# Patient Record
Sex: Female | Born: 1965 | Race: White | Hispanic: No | Marital: Married | Smoking: Never smoker
Health system: Southern US, Community
[De-identification: ages and names within clinical notes are randomized; demographics above are authoritative.]

## PROBLEM LIST (undated history)

## (undated) DIAGNOSIS — Z9889 Other specified postprocedural states: Secondary | ICD-10-CM

## (undated) DIAGNOSIS — R112 Nausea with vomiting, unspecified: Secondary | ICD-10-CM

## (undated) DIAGNOSIS — R519 Headache, unspecified: Secondary | ICD-10-CM

## (undated) DIAGNOSIS — R51 Headache: Secondary | ICD-10-CM

## (undated) DIAGNOSIS — T4145XA Adverse effect of unspecified anesthetic, initial encounter: Secondary | ICD-10-CM

## (undated) DIAGNOSIS — F32A Depression, unspecified: Secondary | ICD-10-CM

## (undated) DIAGNOSIS — T8859XA Other complications of anesthesia, initial encounter: Secondary | ICD-10-CM

## (undated) DIAGNOSIS — F329 Major depressive disorder, single episode, unspecified: Secondary | ICD-10-CM

## (undated) HISTORY — DX: Depression, unspecified: F32.A

## (undated) HISTORY — PX: TUBAL LIGATION: SHX77

## (undated) HISTORY — DX: Major depressive disorder, single episode, unspecified: F32.9

---

## 2003-10-11 ENCOUNTER — Ambulatory Visit (HOSPITAL_COMMUNITY): Admission: RE | Admit: 2003-10-11 | Discharge: 2003-10-11 | Payer: Self-pay | Admitting: Internal Medicine

## 2003-10-11 HISTORY — PX: COLONOSCOPY: SHX174

## 2004-01-03 ENCOUNTER — Other Ambulatory Visit: Admission: RE | Admit: 2004-01-03 | Discharge: 2004-01-03 | Payer: Self-pay | Admitting: Dermatology

## 2007-12-29 ENCOUNTER — Ambulatory Visit (HOSPITAL_COMMUNITY): Admission: RE | Admit: 2007-12-29 | Discharge: 2007-12-29 | Payer: Self-pay | Admitting: Family Medicine

## 2011-02-09 NOTE — Op Note (Signed)
NAME:  Erin Luna, Erin Luna                        ACCOUNT NO.:  [id number]   MEDICAL RECORD NO.:  [id number]                  PATIENT TYPE:  AMB   LOCATION:                                       FACILITY:   PHYSICIAN:  R. Roetta Sessions, M.D.              DATE OF BIRTH:   DATE OF PROCEDURE:  10/11/2003  DATE OF DISCHARGE:                                 OPERATIVE REPORT   PROCEDURE:  Diagnostic colonoscopy and ileoscopy.   ENDOSCOPIST:  Gerrit Friends. Rourk, M.D.   INDICATIONS FOR PROCEDURE:  The patient is a 45 year old lady with a 52-month  history of altered bowel habits.  She describes abdominal rumbling and a  tendency towards diarrhea over the past 7 months.  Stool studies through Dr.  Aurther Loft Daniel's office have been negative.  We started her on some NuLev  recently.  This did not help her symptoms and she wanted to have a  colonoscopy.  We talked about the pros and cons of colonoscopy including the  risk and benefits.  This being done to rule out, basically, an inflammatory  process contributing to her tendency towards diarrhea.  This approach has  been discussed with the patient previously, as stated above.  Please see the  office dictation of September 15, 2003 for more information.   PROCEDURE NOTE:  O2 saturation, blood pressure, pulse and respirations were  monitored throughout the entire procedure.  Conscious sedation: Versed 5 mg IV, Demerol 75 mg IV.   INSTRUMENT:  Olympus 140 colonoscope.   FINDINGS:  A digital rectal exam revealed no abnormalities.   ENDOSCOPIC FINDINGS:  The prep was adequate.   RECTUM:  Examination of the rectal mucosa including the retroflex view of  the anal verge revealed no abnormalities.   COLON:  The colonic mucosa was surveyed from the rectosigmoid junction  through the left transverse and right colon to the area of the appendiceal  orifice, ileocecal valve, and cecum.  These structures were well seen and  photographed for the record.   From this level the scope was slowly withdrawn.  All previously mentioned  mucosal surfaces were again seen.  The colonic mucosa appeared normal.  The  terminal ileum was intubated a good 15 cm and this area of the GI tract also  appeared normal.  Stool residue was suctioned for microbiology studies.  The  patient tolerated the procedure well and was reacted in endoscopy.   IMPRESSION:  1. Normal rectum.  2. Normal colon.  3. Normal terminal ileum.  Stool sample submitted.   RECOMMENDATIONS:  This patient's symptoms most likely will end up being  secondary to diarrhea predominately irritable bowel syndrome.  We will  double check her stool studies.  I have asked her to stop NuLev and begin  Librax 1 tablet before meals and at bedtime, p.r.n. abdominal cramps,  rumbling, and diarrhea.  Further recommendations to follow.  ___________________________________________                                            Jonathon Bellows, M.D.   RMR/MEDQ  D:  10/11/2003  T:  10/12/2003  Job:  [id number]   cc:   Donzetta Sprung  [address], [address]  [place]  [place] [address]  Fax: [phone]   R. Roetta Sessions, M.D.  [address]  [place]  [place] [address]  Fax: [phone]

## 2012-08-06 ENCOUNTER — Other Ambulatory Visit (HOSPITAL_COMMUNITY): Payer: Self-pay | Admitting: Obstetrics & Gynecology

## 2012-08-06 DIAGNOSIS — Z139 Encounter for screening, unspecified: Secondary | ICD-10-CM

## 2012-08-14 ENCOUNTER — Ambulatory Visit (HOSPITAL_COMMUNITY)
Admission: RE | Admit: 2012-08-14 | Discharge: 2012-08-14 | Disposition: A | Discharge status: Home / Self Care | Payer: BC Managed Care – PPO | Source: Ambulatory Visit | Attending: Obstetrics & Gynecology | Admitting: Obstetrics & Gynecology

## 2012-08-14 DIAGNOSIS — Z1231 Encounter for screening mammogram for malignant neoplasm of breast: Secondary | ICD-10-CM | POA: Insufficient documentation

## 2012-08-14 DIAGNOSIS — Z139 Encounter for screening, unspecified: Secondary | ICD-10-CM

## 2013-07-28 ENCOUNTER — Other Ambulatory Visit (HOSPITAL_COMMUNITY): Payer: Self-pay | Admitting: Obstetrics & Gynecology

## 2013-07-28 ENCOUNTER — Other Ambulatory Visit (HOSPITAL_COMMUNITY): Payer: Self-pay | Admitting: *Deleted

## 2013-07-28 DIAGNOSIS — Z139 Encounter for screening, unspecified: Secondary | ICD-10-CM

## 2013-08-17 ENCOUNTER — Ambulatory Visit (HOSPITAL_COMMUNITY)
Admission: RE | Admit: 2013-08-17 | Discharge: 2013-08-17 | Disposition: A | Discharge status: Home / Self Care | Payer: BC Managed Care – PPO | Source: Ambulatory Visit | Attending: Obstetrics & Gynecology | Admitting: Obstetrics & Gynecology

## 2013-08-17 DIAGNOSIS — Z1231 Encounter for screening mammogram for malignant neoplasm of breast: Secondary | ICD-10-CM | POA: Insufficient documentation

## 2013-08-17 DIAGNOSIS — Z139 Encounter for screening, unspecified: Secondary | ICD-10-CM

## 2015-05-25 ENCOUNTER — Other Ambulatory Visit: Payer: Self-pay

## 2015-05-25 ENCOUNTER — Telehealth: Payer: Self-pay

## 2015-05-25 NOTE — Telephone Encounter (Signed)
PT was referred by Dr. Reuel Boom for screening colonoscopy. Last one with Dr. Jena Gauss on 10/11/2003. She has been scheduled an OV with Tana Coast, PA on 06/14/2015 at 9:30 Am due to her med list.

## 2015-05-25 NOTE — Telephone Encounter (Signed)
Opened in error

## 2015-06-14 ENCOUNTER — Ambulatory Visit (INDEPENDENT_AMBULATORY_CARE_PROVIDER_SITE_OTHER): Payer: Commercial Managed Care - HMO | Admitting: Gastroenterology

## 2015-06-14 ENCOUNTER — Encounter: Payer: Self-pay | Admitting: Gastroenterology

## 2015-06-14 ENCOUNTER — Other Ambulatory Visit: Payer: Self-pay

## 2015-06-14 VITALS — BP 111/70 | HR 85 | Temp 97.8°F | Ht 67.0 in | Wt 157.6 lb

## 2015-06-14 DIAGNOSIS — Z79899 Other long term (current) drug therapy: Secondary | ICD-10-CM | POA: Diagnosis not present

## 2015-06-14 DIAGNOSIS — Z1211 Encounter for screening for malignant neoplasm of colon: Secondary | ICD-10-CM | POA: Diagnosis not present

## 2015-06-14 MED ORDER — PEG 3350-KCL-NA BICARB-NACL 420 G PO SOLR: 4000.0000 mL | Freq: Once | ORAL | Status: AC

## 2015-06-14 NOTE — Patient Instructions (Signed)
1. Colonoscopy in near future. See separate instructions.  

## 2015-06-14 NOTE — Progress Notes (Signed)
cc'ed to pcp °

## 2015-06-14 NOTE — Assessment & Plan Note (Signed)
49 year old female due for screening colonoscopy. Plan on deep sedation in the OR given polypharmacy.  I have discussed the risks, alternatives, benefits with regards to but not limited to the risk of reaction to medication, bleeding, infection, perforation and the patient is agreeable to proceed. Written consent to be obtained.

## 2015-06-14 NOTE — Progress Notes (Signed)
Primary Care Physician:  Donzetta Sprung, MD  Primary Gastroenterologist:  Roetta Sessions, MD   Chief Complaint  Patient presents with  . Colonoscopy    HPI:  Erin Luna is a 49 y.o. female here to schedule screening colonoscopy. Because she is on multiple antidepressants, she was asked to come in for an office visit to determine type of sedation.  Doing well. Generally has regular bowel movements. Occasional IBS D flare. Seems to be triggered by anxiety. No melena, brbpr. No heartburn, dysphagia. Appetite okay. No unintentional weight loss. No family history of colon cancer or colon polyps.  Last colonoscopy in January 2005 when she presented with diarrhea. Unremarkable exam including normal terminal ileum.  Current Outpatient Prescriptions  Medication Sig Dispense Refill  . buPROPion (WELLBUTRIN SR) 150 MG 12 hr tablet Take 150 mg by mouth daily.    . DULoxetine (CYMBALTA) 60 MG capsule Take 60 mg by mouth daily.    . traZODone (DESYREL) 50 MG tablet Take 50 mg by mouth at bedtime.     No current facility-administered medications for this visit.    Allergies as of 06/14/2015 - Review Complete 06/14/2015  Allergen Reaction Noted  . Penicillins Rash 05/25/2015    Past Medical History  Diagnosis Date  . Depression     Past Surgical History  Procedure Laterality Date  . Colonoscopy  10/11/2003    ZOX:[id number] colon and rectum  . Tubal ligation      Family History  Problem Relation Age of Onset  . Colon cancer Neg Hx   . Colon polyps Neg Hx   . Diabetes Mother     Social History   Social History  . Marital Status: Divorced    Spouse Name: N/A  . Number of Children: 1  . Years of Education: N/A   Occupational History  . loan clerk    Social History Main Topics  . Smoking status: Never Smoker   . Smokeless tobacco: Not on file  . Alcohol Use: No  . Drug Use: No  . Sexual Activity: Not on file   Other Topics Concern  . Not on file   Social History  Narrative  . No narrative on file      ROS:  General: Negative for anorexia, weight loss, fever, chills, fatigue, weakness. Eyes: Negative for vision changes.  ENT: Negative for hoarseness, difficulty swallowing , nasal congestion. CV: Negative for chest pain, angina, palpitations, dyspnea on exertion, peripheral edema.  Respiratory: Negative for dyspnea at rest, dyspnea on exertion, cough, sputum, wheezing.  GI: See history of present illness. GU:  Negative for dysuria, hematuria, urinary incontinence, urinary frequency, nocturnal urination.  MS: Negative for joint pain, low back pain.  Derm: Negative for rash or itching.  Neuro: Negative for weakness, abnormal sensation, seizure, frequent headaches, memory loss, confusion.  Psych: Negative for anxiety, depression, suicidal ideation, hallucinations.  Endo: Negative for unusual weight change.  Heme: Negative for bruising or bleeding. Allergy: Negative for rash or hives.    Physical Examination:  BP 111/70 mmHg  Pulse 85  Temp(Src) 97.8 F (36.6 C) (Oral)  Ht  (1.702 m)  Wt 157 lb 9.6 oz (71.487 kg)  BMI 24.68 kg/m2  LMP 06/14/2015   General: Well-nourished, well-developed in no acute distress.  Head: Normocephalic, atraumatic.   Eyes: Conjunctiva pink, no icterus. Mouth: Oropharyngeal mucosa moist and pink , no lesions erythema or exudate. Neck: Supple without thyromegaly, masses, or lymphadenopathy.  Lungs: Clear to auscultation bilaterally.  Heart: Regular  rate and rhythm, no murmurs rubs or gallops.  Abdomen: Bowel sounds are normal, nontender, nondistended, no hepatosplenomegaly or masses, no abdominal bruits or    hernia , no rebound or guarding.   Rectal: deferred to time of colonoscopy. Extremities: No lower extremity edema. No clubbing or deformities.  Neuro: Alert and oriented x 4 , grossly normal neurologically.  Skin: Warm and dry, no rash or jaundice.   Psych: Alert and cooperative, normal mood and  affect.  Labs: None available.  Imaging Studies: No results found.

## 2015-06-30 NOTE — Patient Instructions (Signed)
Erin Luna  06/30/2015     @   Your procedure is scheduled on 07/07/2015.  Report to [facility] at 8:45 A.M.  Call this number if you have problems the morning of surgery:  [phone]   Remember:  Do not eat food or drink liquids after midnight.  Take these medicines the morning of surgery with A SIP OF WATER Wellbutrin, Cymbalta   Do not wear jewelry, make-up or nail polish.  Do not wear lotions, powders, or perfumes.  You may wear deodorant.  Do not shave 48 hours prior to surgery.  Men may shave face and neck.  Do not bring valuables to the hospital.  [facility] is not responsible for any belongings or valuables.  Contacts, dentures or bridgework may not be worn into surgery.  Leave your suitcase in the car.  After surgery it may be brought to your room.  For patients admitted to the hospital, discharge time will be determined by your treatment team.  Patients discharged the day of surgery will not be allowed to drive home.    Please read over the following fact sheets that you were given. Anesthesia Post-op Instructions     PATIENT INSTRUCTIONS POST-ANESTHESIA  IMMEDIATELY FOLLOWING SURGERY:  Do not drive or operate machinery for the first twenty four hours after surgery.  Do not make any important decisions for twenty four hours after surgery or while taking narcotic pain medications or sedatives.  If you develop intractable nausea and vomiting or a severe headache please notify your doctor immediately.  FOLLOW-UP:  Please make an appointment with your surgeon as instructed. You do not need to follow up with anesthesia unless specifically instructed to do so.  WOUND CARE INSTRUCTIONS (if applicable):  Keep a dry clean dressing on the anesthesia/puncture wound site if there is drainage.  Once the wound has quit draining you may leave it open to air.  Generally you should leave the bandage intact for twenty four hours unless there is drainage.   If the epidural site drains for more than 36-48 hours please call the anesthesia department.  QUESTIONS?:  Please feel free to call your physician or the hospital operator if you have any questions, and they will be happy to assist you.      Colonoscopy A colonoscopy is an exam to look at the entire large intestine (colon). This exam can help find problems such as tumors, polyps, inflammation, and areas of bleeding. The exam takes about 1 hour.  LET [facility] CARE PROVIDER KNOW ABOUT:   Any allergies you have.  All medicines you are taking, including vitamins, herbs, eye drops, creams, and over-the-counter medicines.  Previous problems you or members of your family have had with the use of anesthetics.  Any blood disorders you have.  Previous surgeries you have had.  Medical conditions you have. RISKS AND COMPLICATIONS  Generally, this is a safe procedure. However, as with any procedure, complications can occur. Possible complications include:  Bleeding.  Tearing or rupture of the colon wall.  Reaction to medicines given during the exam.  Infection (rare). BEFORE THE PROCEDURE   Ask your health care provider about changing or stopping your regular medicines.  You may be prescribed an oral bowel prep. This involves drinking a large amount of medicated liquid, starting the day before your procedure. The liquid will cause you to have multiple loose stools until your stool is almost clear or light green. This cleans out your colon in preparation  for the procedure.  Do not eat or drink anything else once you have started the bowel prep, unless your health care provider tells you it is safe to do so.  Arrange for someone to drive you home after the procedure. PROCEDURE   You will be given medicine to help you relax (sedative).  You will lie on your side with your knees bent.  A long, flexible tube with a light and camera on the end (colonoscope) will be inserted through the  rectum and into the colon. The camera sends video back to a computer screen as it moves through the colon. The colonoscope also releases carbon dioxide gas to inflate the colon. This helps your health care provider see the area better.  During the exam, your health care provider may take a small tissue sample (biopsy) to be examined under a microscope if any abnormalities are found.  The exam is finished when the entire colon has been viewed. AFTER THE PROCEDURE   Do not drive for 24 hours after the exam.  You may have a small amount of blood in your stool.  You may pass moderate amounts of gas and have mild abdominal cramping or bloating. This is caused by the gas used to inflate your colon during the exam.  Ask when your test results will be ready and how you will get your results. Make sure you get your test results.   This information is not intended to replace advice given to you by your health care provider. Make sure you discuss any questions you have with your health care provider.   Document Released: 09/07/2000 Document Revised: 07/01/2013 Document Reviewed: 05/18/2013 Elsevier Interactive Patient Education [employer].

## 2015-07-01 ENCOUNTER — Encounter (HOSPITAL_COMMUNITY): Payer: Self-pay

## 2015-07-01 ENCOUNTER — Encounter (HOSPITAL_COMMUNITY)
Admission: RE | Admit: 2015-07-01 | Discharge: 2015-07-01 | Disposition: A | Discharge status: Home / Self Care | Payer: Commercial Managed Care - HMO | Source: Ambulatory Visit | Attending: Internal Medicine | Admitting: Internal Medicine

## 2015-07-01 DIAGNOSIS — Z01818 Encounter for other preprocedural examination: Secondary | ICD-10-CM | POA: Insufficient documentation

## 2015-07-01 HISTORY — DX: Adverse effect of unspecified anesthetic, initial encounter: T41.45XA

## 2015-07-01 HISTORY — DX: Other specified postprocedural states: R11.2

## 2015-07-01 HISTORY — DX: Other complications of anesthesia, initial encounter: T88.59XA

## 2015-07-01 HISTORY — DX: Other specified postprocedural states: Z98.890

## 2015-07-01 HISTORY — DX: Nausea with vomiting, unspecified: R11.2

## 2015-07-01 LAB — CBC
HEMATOCRIT: 37.2 % (ref 36.0–46.0)
HEMOGLOBIN: 12.3 g/dL (ref 12.0–15.0)
MCH: 29.9 pg (ref 26.0–34.0)
MCHC: 33.1 g/dL (ref 30.0–36.0)
MCV: 90.5 fL (ref 78.0–100.0)
Platelets: 307 10*3/uL (ref 150–400)
RBC: 4.11 MIL/uL (ref 3.87–5.11)
RDW: 12.8 % (ref 11.5–15.5)
WBC: 5.1 10*3/uL (ref 4.0–10.5)

## 2015-07-01 LAB — BASIC METABOLIC PANEL
ANION GAP: 5 (ref 5–15)
BUN: 12 mg/dL (ref 6–20)
CALCIUM: 9 mg/dL (ref 8.9–10.3)
CO2: 27 mmol/L (ref 22–32)
CREATININE: 0.64 mg/dL (ref 0.44–1.00)
Chloride: 107 mmol/L (ref 101–111)
GFR calc non Af Amer: >60 mL/min (ref >60)
Glucose, Bld: 95 mg/dL (ref 65–99)
Potassium: 4.4 mmol/L (ref 3.5–5.1)
SODIUM: 139 mmol/L (ref 135–145)

## 2015-07-07 ENCOUNTER — Ambulatory Visit (HOSPITAL_COMMUNITY)
Admission: RE | Admit: 2015-07-07 | Discharge: 2015-07-07 | Disposition: A | Discharge status: Home / Self Care | Payer: Commercial Managed Care - HMO | Source: Ambulatory Visit | Attending: Internal Medicine | Admitting: Internal Medicine

## 2015-07-07 ENCOUNTER — Encounter (HOSPITAL_COMMUNITY)
Admission: RE | Disposition: A | Discharge status: Home / Self Care | Payer: Self-pay | Source: Ambulatory Visit | Attending: Internal Medicine

## 2015-07-07 ENCOUNTER — Ambulatory Visit (HOSPITAL_COMMUNITY): Payer: Commercial Managed Care - HMO | Admitting: Anesthesiology

## 2015-07-07 ENCOUNTER — Encounter (HOSPITAL_COMMUNITY): Payer: Self-pay | Admitting: *Deleted

## 2015-07-07 DIAGNOSIS — Z1211 Encounter for screening for malignant neoplasm of colon: Secondary | ICD-10-CM | POA: Diagnosis not present

## 2015-07-07 DIAGNOSIS — F329 Major depressive disorder, single episode, unspecified: Secondary | ICD-10-CM | POA: Diagnosis not present

## 2015-07-07 HISTORY — DX: Headache: R51

## 2015-07-07 HISTORY — DX: Headache, unspecified: R51.9

## 2015-07-07 HISTORY — PX: COLONOSCOPY WITH PROPOFOL: SHX5780

## 2015-07-07 SURGERY — COLONOSCOPY WITH PROPOFOL
Anesthesia: Monitor Anesthesia Care

## 2015-07-07 MED ORDER — LIDOCAINE HCL (PF) 1 % IJ SOLN
INTRAMUSCULAR | Status: AC
  Filled 2015-07-07: qty 5

## 2015-07-07 MED ORDER — STERILE WATER FOR IRRIGATION IR SOLN
Status: DC | PRN
  Administered 2015-07-07: 1000 mL

## 2015-07-07 MED ORDER — GLYCOPYRROLATE 0.2 MG/ML IJ SOLN
0.2000 mg | Freq: Once | INTRAMUSCULAR | Status: AC
  Administered 2015-07-07: 0.2 mg via INTRAVENOUS

## 2015-07-07 MED ORDER — PROPOFOL 10 MG/ML IV BOLUS
INTRAVENOUS | Status: DC | PRN
  Administered 2015-07-07: 21 mg via INTRAVENOUS
  Administered 2015-07-07: 14 mg via INTRAVENOUS

## 2015-07-07 MED ORDER — FENTANYL CITRATE (PF) 100 MCG/2ML IJ SOLN
25.0000 ug | INTRAMUSCULAR | Status: AC
  Administered 2015-07-07: 25 ug via INTRAVENOUS

## 2015-07-07 MED ORDER — PROPOFOL 10 MG/ML IV BOLUS
INTRAVENOUS | Status: AC
  Filled 2015-07-07: qty 20

## 2015-07-07 MED ORDER — WATER FOR IRRIGATION, STERILE IR SOLN
Status: DC | PRN
  Administered 2015-07-07: 1000 mL

## 2015-07-07 MED ORDER — GLYCOPYRROLATE 0.2 MG/ML IJ SOLN
INTRAMUSCULAR | Status: AC
  Filled 2015-07-07: qty 1

## 2015-07-07 MED ORDER — MIDAZOLAM HCL 2 MG/2ML IJ SOLN
INTRAMUSCULAR | Status: AC
  Filled 2015-07-07: qty 2

## 2015-07-07 MED ORDER — LACTATED RINGERS IV SOLN
INTRAVENOUS | Status: DC
  Administered 2015-07-07: 10:00:00 via INTRAVENOUS

## 2015-07-07 MED ORDER — PROPOFOL 500 MG/50ML IV EMUL
INTRAVENOUS | Status: DC | PRN
  Administered 2015-07-07 (×2): 125 ug/kg/min via INTRAVENOUS

## 2015-07-07 MED ORDER — MIDAZOLAM HCL 2 MG/2ML IJ SOLN
1.0000 mg | INTRAMUSCULAR | Status: DC | PRN
Start: 2015-07-07 — End: 2015-07-07
  Administered 2015-07-07: 2 mg via INTRAVENOUS

## 2015-07-07 MED ORDER — FENTANYL CITRATE (PF) 100 MCG/2ML IJ SOLN
INTRAMUSCULAR | Status: AC
  Filled 2015-07-07: qty 2

## 2015-07-07 MED ORDER — ONDANSETRON HCL 4 MG/2ML IJ SOLN
4.0000 mg | Freq: Once | INTRAMUSCULAR | Status: AC | PRN
  Administered 2015-07-07: 4 mg via INTRAVENOUS

## 2015-07-07 MED ORDER — FENTANYL CITRATE (PF) 100 MCG/2ML IJ SOLN: 25.0000 ug | INTRAMUSCULAR | Status: DC | PRN

## 2015-07-07 MED ORDER — ONDANSETRON HCL 4 MG/2ML IJ SOLN: 4.0000 mg | Freq: Once | INTRAMUSCULAR | Status: DC

## 2015-07-07 MED ORDER — ONDANSETRON HCL 4 MG/2ML IJ SOLN
INTRAMUSCULAR | Status: AC
  Filled 2015-07-07: qty 2

## 2015-07-07 MED ORDER — LIDOCAINE HCL (CARDIAC) 10 MG/ML IV SOLN
INTRAVENOUS | Status: DC | PRN
  Administered 2015-07-07: 50 mg via INTRAVENOUS

## 2015-07-07 SURGICAL SUPPLY — 23 items
ELECT REM PT RETURN 9FT ADLT (ELECTROSURGICAL)
ELECTRODE REM PT RTRN 9FT ADLT (ELECTROSURGICAL) IMPLANT
FCP BXJMBJMB 240X2.8X (CUTTING FORCEPS)
FLOOR PAD 36X40 (MISCELLANEOUS)
FORCEPS BIOP RAD 4 LRG CAP 4 (CUTTING FORCEPS) IMPLANT
FORCEPS BIOP RJ4 240 W/NDL (CUTTING FORCEPS)
FORCEPS BXJMBJMB 240X2.8X (CUTTING FORCEPS) IMPLANT
FORMALIN 10 PREFIL 20ML (MISCELLANEOUS) IMPLANT
INJECTOR/SNARE I SNARE (MISCELLANEOUS) IMPLANT
KIT ENDO PROCEDURE PEN (KITS) ×3 IMPLANT
MANIFOLD NEPTUNE II (INSTRUMENTS) ×2 IMPLANT
NDL SCLEROTHERAPY 25GX240 (NEEDLE) IMPLANT
NEEDLE SCLEROTHERAPY 25GX240 (NEEDLE) IMPLANT
PAD FLOOR 36X40 (MISCELLANEOUS) IMPLANT
PROBE APC STR FIRE (PROBE) IMPLANT
PROBE INJECTION GOLD (MISCELLANEOUS)
PROBE INJECTION GOLD 7FR (MISCELLANEOUS) IMPLANT
SNARE ROTATE MED OVAL 20MM (MISCELLANEOUS) IMPLANT
SNARE SHORT THROW 13M SML OVAL (MISCELLANEOUS) IMPLANT
SYR 50ML LL SCALE MARK (SYRINGE) ×3 IMPLANT
TRAP SPECIMEN MUCOUS 40CC (MISCELLANEOUS) IMPLANT
TUBING IRRIGATION ENDOGATOR (MISCELLANEOUS) ×3 IMPLANT
WATER STERILE IRR 1000ML POUR (IV SOLUTION) ×3 IMPLANT

## 2015-07-07 NOTE — Transfer of Care (Signed)
Immediate Anesthesia Transfer of Care Note  Patient: Erin Luna  Procedure(s) Performed: Procedure(s): COLONOSCOPY WITH PROPOFOL; IN CECUM AT 1013; WITHDRAWAL TIME 7 MINUTES (N/A)  Patient Location: PACU  Anesthesia Type:MAC  Level of Consciousness: patient cooperative  Airway & Oxygen Therapy: Patient Spontanous Breathing and non-rebreather face mask  Post-op Assessment: Report given to RN, Post -op Vital signs reviewed and stable and Patient moving all extremities  Post vital signs: Reviewed and stable    Complications: No apparent anesthesia complications

## 2015-07-07 NOTE — H&P (View-Only) (Signed)
Primary Care Physician:  Donzetta Sprung, MD  Primary Gastroenterologist:  Roetta Sessions, MD   Chief Complaint  Patient presents with  . Colonoscopy    HPI:  Erin Luna is a 49 y.o. female here to schedule screening colonoscopy. Because she is on multiple antidepressants, she was asked to come in for an office visit to determine type of sedation.  Doing well. Generally has regular bowel movements. Occasional IBS D flare. Seems to be triggered by anxiety. No melena, brbpr. No heartburn, dysphagia. Appetite okay. No unintentional weight loss. No family history of colon cancer or colon polyps.  Last colonoscopy in January 2005 when she presented with diarrhea. Unremarkable exam including normal terminal ileum.  Current Outpatient Prescriptions  Medication Sig Dispense Refill  . buPROPion (WELLBUTRIN SR) 150 MG 12 hr tablet Take 150 mg by mouth daily.    . DULoxetine (CYMBALTA) 60 MG capsule Take 60 mg by mouth daily.    . traZODone (DESYREL) 50 MG tablet Take 50 mg by mouth at bedtime.     No current facility-administered medications for this visit.    Allergies as of 06/14/2015 - Review Complete 06/14/2015  Allergen Reaction Noted  . Penicillins Rash 05/25/2015    Past Medical History  Diagnosis Date  . Depression     Past Surgical History  Procedure Laterality Date  . Colonoscopy  10/11/2003    [id number] colon and rectum  . Tubal ligation      Family History  Problem Relation Age of Onset  . Colon cancer Neg Hx   . Colon polyps Neg Hx   . Diabetes Mother     Social History   Social History  . Marital Status: Divorced    Spouse Name: N/A  . Number of Children: 1  . Years of Education: N/A   Occupational History  . loan clerk    Social History Main Topics  . Smoking status: Never Smoker   . Smokeless tobacco: Not on file  . Alcohol Use: No  . Drug Use: No  . Sexual Activity: Not on file   Other Topics Concern  . Not on file   Social History  Narrative  . No narrative on file      ROS:  General: Negative for anorexia, weight loss, fever, chills, fatigue, weakness. Eyes: Negative for vision changes.  ENT: Negative for hoarseness, difficulty swallowing , nasal congestion. CV: Negative for chest pain, angina, palpitations, dyspnea on exertion, peripheral edema.  Respiratory: Negative for dyspnea at rest, dyspnea on exertion, cough, sputum, wheezing.  GI: See history of present illness. GU:  Negative for dysuria, hematuria, urinary incontinence, urinary frequency, nocturnal urination.  MS: Negative for joint pain, low back pain.  Derm: Negative for rash or itching.  Neuro: Negative for weakness, abnormal sensation, seizure, frequent headaches, memory loss, confusion.  Psych: Negative for anxiety, depression, suicidal ideation, hallucinations.  Endo: Negative for unusual weight change.  Heme: Negative for bruising or bleeding. Allergy: Negative for rash or hives.    Physical Examination:  BP 111/70 mmHg  Pulse 85  Temp(Src) 97.8 F (36.6 C) (Oral)  Ht  (1.702 m)  Wt 157 lb 9.6 oz (71.487 kg)  BMI 24.68 kg/m2  LMP 06/14/2015   General: Well-nourished, well-developed in no acute distress.  Head: Normocephalic, atraumatic.   Eyes: Conjunctiva pink, no icterus. Mouth: Oropharyngeal mucosa moist and pink , no lesions erythema or exudate. Neck: Supple without thyromegaly, masses, or lymphadenopathy.  Lungs: Clear to auscultation bilaterally.  Heart: Regular  rate and rhythm, no murmurs rubs or gallops.  Abdomen: Bowel sounds are normal, nontender, nondistended, no hepatosplenomegaly or masses, no abdominal bruits or    hernia , no rebound or guarding.   Rectal: deferred to time of colonoscopy. Extremities: No lower extremity edema. No clubbing or deformities.  Neuro: Alert and oriented x 4 , grossly normal neurologically.  Skin: Warm and dry, no rash or jaundice.   Psych: Alert and cooperative, normal mood and  affect.  Labs: None available.  Imaging Studies: No results found.

## 2015-07-07 NOTE — Anesthesia Procedure Notes (Signed)
Procedure Name: MAC Date/Time: 07/07/2015 9:54 AM Performed by: Franco NonesYATES, Gladyes Kudo S Pre-anesthesia Checklist: Patient identified, Emergency Drugs available, Suction available, Timeout performed and Patient being monitored Patient Re-evaluated:Patient Re-evaluated prior to inductionOxygen Delivery Method: Non-rebreather mask

## 2015-07-07 NOTE — Anesthesia Preprocedure Evaluation (Signed)
Anesthesia Evaluation  Patient identified by MRN, date of birth, ID band Patient awake    Reviewed: Allergy & Precautions, NPO status , Patient's Chart, lab work & pertinent test results  History of Anesthesia Complications (+) PONV and history of anesthetic complications  Airway Mallampati: II  TM Distance: >3 FB     Dental  (+) Teeth Intact   Pulmonary neg pulmonary ROS,    breath sounds clear to auscultation       Cardiovascular negative cardio ROS   Rhythm:Regular Rate:Normal     Neuro/Psych PSYCHIATRIC DISORDERS Depression    GI/Hepatic negative GI ROS,   Endo/Other    Renal/GU      Musculoskeletal   Abdominal   Peds  Hematology   Anesthesia Other Findings   Reproductive/Obstetrics                             Anesthesia Physical Anesthesia Plan  ASA: II  Anesthesia Plan: MAC   Post-op Pain Management:    Induction: Intravenous  Airway Management Planned: Simple Face Mask  Additional Equipment:   Intra-op Plan:   Post-operative Plan:   Informed Consent: I have reviewed the patients History and Physical, chart, labs and discussed the procedure including the risks, benefits and alternatives for the proposed anesthesia with the patient or authorized representative who has indicated his/her understanding and acceptance.     Plan Discussed with:   Anesthesia Plan Comments:         Anesthesia Quick Evaluation

## 2015-07-07 NOTE — Discharge Instructions (Signed)

## 2015-07-07 NOTE — Interval H&P Note (Signed)
History and Physical Interval Note:  07/07/2015 9:35 AM  Erin Luna  has presented today for surgery, with the diagnosis of screening colonoscopy  The various methods of treatment have been discussed with the patient and family. After consideration of risks, benefits and other options for treatment, the patient has consented to  Procedure(s): COLONOSCOPY WITH PROPOFOL (N/A) as a surgical intervention .  The patient's history has been reviewed, patient examined, no change in status, stable for surgery.  I have reviewed the patient's chart and labs.  Questions were answered to the patient's satisfaction.     Erin Luna  No change. Screening colonoscopy per plan. @LOGO @   Primary Care Physician:  Erin SprungANIEL, TERRY, MD Primary Gastroenterologist:  Dr.   Pre-Procedure History & Physical: HPI:  Erin Luna is a 49 y.o. female is here for a screening colonoscopy.   Past Medical History  Diagnosis Date  . Depression   . Complication of anesthesia   . PONV (postoperative nausea and vomiting)   . Headache     Past Surgical History  Procedure Laterality Date  . Colonoscopy  10/11/2003    AVW:[id number]:normal colon and rectum  . Tubal ligation      Prior to Admission medications   Medication Sig Start Date End Date Taking? Authorizing Provider  buPROPion (WELLBUTRIN SR) 150 MG 12 hr tablet Take 150 mg by mouth daily.   Yes Historical Provider, MD  DULoxetine (CYMBALTA) 60 MG capsule Take 60 mg by mouth daily.   Yes Historical Provider, MD  traZODone (DESYREL) 50 MG tablet Take 50 mg by mouth at bedtime.   Yes Historical Provider, MD  polyethylene glycol-electrolytes (NULYTELY/GOLYTELY) 420 G solution Take 4,000 mLs by mouth once. 06/14/15   Erin KocherLeslie S Lewis, PA-C    Allergies as of 06/14/2015 - Review Complete 06/14/2015  Allergen Reaction Noted  . Penicillins Rash 05/25/2015    Family History  Problem Relation Age of Onset  . Colon cancer Neg Hx   . Colon polyps Neg Hx   . Diabetes  Mother     Social History   Social History  . Marital Status: Married    Spouse Name: N/A  . Number of Children: 1  . Years of Education: N/A   Occupational History  . loan clerk    Social History Main Topics  . Smoking status: Never Smoker   . Smokeless tobacco: Not on file  . Alcohol Use: No  . Drug Use: No  . Sexual Activity: Not on file   Other Topics Concern  . Not on file   Social History Narrative    Review of Systems: See HPI, otherwise negative ROS  Physical Exam: Pulse 74  Temp(Src) 98 F (36.7 C) (Oral)  Resp 15  Ht 5\' 7"  (1.702 m)  Wt 157 lb (71.215 kg)  BMI 24.58 kg/m2  SpO2 100%  LMP 06/14/2015 General:   Alert,  Well-developed, well-nourished, pleasant and cooperative in NAD Head:  Normocephalic and atraumatic. Eyes:  Sclera clear, no icterus.   Conjunctiva pink. Ears:  Normal auditory acuity. Nose:  No deformity, discharge,  or lesions. Mouth:  No deformity or lesions, dentition normal. Neck:  Supple; no masses or thyromegaly. Lungs:  Clear throughout to auscultation.   No wheezes, crackles, or rhonchi. No acute distress. Heart:  Regular rate and rhythm; no murmurs, clicks, rubs,  or gallops. Abdomen:  Soft, nontender and nondistended. No masses, hepatosplenomegaly or hernias noted. Normal bowel sounds, without guarding, and without rebound.   Msk:  Symmetrical without gross deformities. Normal posture. Pulses:  Normal pulses noted. Extremities:  Without clubbing or edema. Neurologic:  Alert and  oriented x4;  grossly normal neurologically. Skin:  Intact without significant lesions or rashes. Cervical Nodes:  No significant cervical adenopathy. Psych:  Alert and cooperative. Normal mood and affect.  Impression/Plan: Erin Luna is now here to undergo a screening colonoscopy.  Risks, benefits, limitations, imponderables and alternatives regarding colonoscopy have been reviewed with the patient. Questions have been answered. All parties  agreeable.     Notice:  This dictation was prepared with Dragon dictation along with smaller phrase technology. Any transcriptional errors that result from this process are unintentional and may not be corrected upon review.

## 2015-07-07 NOTE — Anesthesia Postprocedure Evaluation (Signed)
  Anesthesia Post-op Note  Patient: Erin Luna  Procedure(s) Performed: Procedure(s) (LRB): COLONOSCOPY WITH PROPOFOL; IN CECUM AT 1013; WITHDRAWAL TIME 7 MINUTES (N/A)  Patient Location:  [facility]  Anesthesia Type: MAC  Level of Consciousness: awake  Airway and Oxygen Therapy: Patient Spontanous Breathing  Post-op Pain: none  Post-op Assessment: Post-op Vital signs reviewed, Patient's Cardiovascular Status Stable, Respiratory Function Stable, Patent Airway, No signs of Nausea or vomiting and Pain level controlled  Post-op Vital Signs: Reviewed and stable  Complications: No apparent anesthesia complications

## 2015-07-08 ENCOUNTER — Encounter (HOSPITAL_COMMUNITY): Payer: Self-pay | Admitting: Internal Medicine

## 2015-07-08 NOTE — Op Note (Signed)
[facility] [address] [place], [id number]   COLONOSCOPY PROCEDURE REPORT  PATIENT: Erin Luna, Erin Luna  MR#: [id number] BIRTHDATE: Dec 12, 1965 , 49  yrs. old GENDER: female ENDOSCOPIST: Corbin Adeobert M Rourk, Physician REFERRED XB:JYNWGBY:Terry Reuel Boomaniel, M.D. PROCEDURE DATE:  07/07/2015 PROCEDURE:   Colonoscopy, screening INDICATIONS:Average risk colorectal cancer screening examination. MEDICATIONS: Deep sedation per Dr[facility]  [facility] ASA CLASS:       Class II  CONSENT: The risks, benefits, alternatives and imponderables including but not limited to bleeding, perforation as well as the possibility of a missed lesion have been reviewed.  The potential for biopsy, lesion removal, etc. have also been discussed. Questions have been answered.  All parties agreeable.  Please see the history and physical in the medical record for more information.  DESCRIPTION OF PROCEDURE:   After the risks benefits and alternatives of the procedure were thoroughly explained, informed consent was obtained.  The digital rectal exam revealed no abnormalities of the rectum.   The     endoscope was introduced through the anus and advanced to the cecum, which was identified by both the appendix and ileocecal valve. No adverse events experienced.   The quality of the prep was adequate  The instrument was then slowly withdrawn as the colon was fully examined. Estimated blood loss is zero unless otherwise noted in this procedure report.      COLON FINDINGS: Normal-appearing rectal mucosa.  Normal-appearing colonic mucosa.  Retroflexion was performed. .  Withdrawal time=7 minutes 0 seconds.  The scope was withdrawn and the procedure completed. COMPLICATIONS: There were no immediate complications.  ENDOSCOPIC IMPRESSION: Normal colonoscopy  RECOMMENDATIONS: Repeat colonoscopy for screening purposes in 10 years  eSigned:  Corbin Adeobert M Rourk, Physician 07/07/2015 10:26 AM   cc:  CPT  CODES: ICD CODES:  The ICD and CPT codes recommended by this software are interpretations from the data that the clinical staff has captured with the software.  The verification of the translation of this report to the ICD and CPT codes and modifiers is the sole responsibility of the health care institution and practicing physician where this report was generated.  [employer] will not be held responsible for the validity of the ICD and CPT codes included on this report.  AMA assumes no liability for data contained or not contained herein. CPT is a Publishing rights managerregistered trademark of the [employer].

## 2015-10-20 ENCOUNTER — Ambulatory Visit (INDEPENDENT_AMBULATORY_CARE_PROVIDER_SITE_OTHER): Payer: Commercial Managed Care - HMO | Admitting: Otolaryngology

## 2015-10-20 DIAGNOSIS — J342 Deviated nasal septum: Secondary | ICD-10-CM | POA: Diagnosis not present

## 2015-10-20 DIAGNOSIS — J310 Chronic rhinitis: Secondary | ICD-10-CM | POA: Diagnosis not present

## 2015-10-20 DIAGNOSIS — J343 Hypertrophy of nasal turbinates: Secondary | ICD-10-CM

## 2015-10-26 ENCOUNTER — Other Ambulatory Visit (INDEPENDENT_AMBULATORY_CARE_PROVIDER_SITE_OTHER): Payer: Self-pay | Admitting: Otolaryngology

## 2015-10-26 DIAGNOSIS — J329 Chronic sinusitis, unspecified: Secondary | ICD-10-CM

## 2015-11-04 ENCOUNTER — Ambulatory Visit (HOSPITAL_COMMUNITY)
Admission: RE | Admit: 2015-11-04 | Discharge: 2015-11-04 | Disposition: A | Discharge status: Home / Self Care | Payer: Commercial Managed Care - HMO | Source: Ambulatory Visit | Attending: Otolaryngology | Admitting: Otolaryngology

## 2015-11-04 DIAGNOSIS — J0101 Acute recurrent maxillary sinusitis: Secondary | ICD-10-CM | POA: Diagnosis not present

## 2015-11-04 DIAGNOSIS — J329 Chronic sinusitis, unspecified: Secondary | ICD-10-CM

## 2015-11-04 DIAGNOSIS — J338 Other polyp of sinus: Secondary | ICD-10-CM | POA: Insufficient documentation

## 2015-11-10 ENCOUNTER — Ambulatory Visit (INDEPENDENT_AMBULATORY_CARE_PROVIDER_SITE_OTHER): Payer: Commercial Managed Care - HMO | Admitting: Otolaryngology

## 2015-11-17 ENCOUNTER — Ambulatory Visit (INDEPENDENT_AMBULATORY_CARE_PROVIDER_SITE_OTHER): Payer: Commercial Managed Care - HMO | Admitting: Otolaryngology

## 2015-11-17 DIAGNOSIS — J343 Hypertrophy of nasal turbinates: Secondary | ICD-10-CM

## 2015-11-17 DIAGNOSIS — J342 Deviated nasal septum: Secondary | ICD-10-CM | POA: Diagnosis not present

## 2015-11-17 DIAGNOSIS — J330 Polyp of nasal cavity: Secondary | ICD-10-CM | POA: Diagnosis not present

## 2015-11-17 DIAGNOSIS — J310 Chronic rhinitis: Secondary | ICD-10-CM | POA: Diagnosis not present

## 2016-02-14 IMAGING — CT CT MAXILLOFACIAL W/O CM
3 of 4 series · 14 of 47 positions shown, 16 images · non-contrast
Comparison: None.

CLINICAL DATA: Recurrent sinusitis.

EXAM:
CT MAXILLOFACIAL WITHOUT CONTRAST
TECHNIQUE: Multidetector CT imaging of the maxillofacial structures was
performed. Multiplanar CT image reconstructions were also generated.
A small metallic BB was placed on the right temple in order to
reliably differentiate right from left.

[Series 3: stealth sinus sinus alg 1.0 · axial · 0.32mm/px · z∈[+74,+170]mm · 8 of 113 slices shown, 10 images]
[im 9/113  brain]
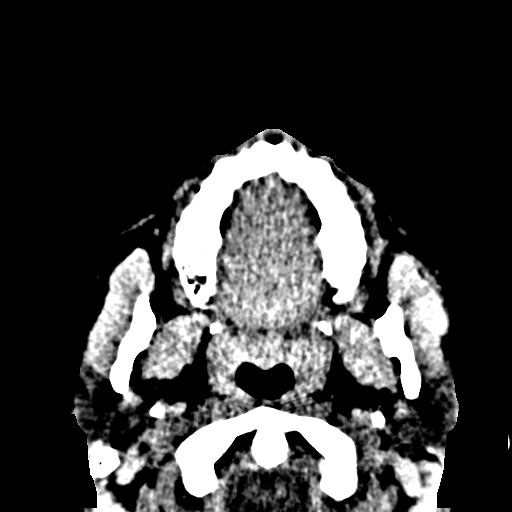
[im 9/113  bone]
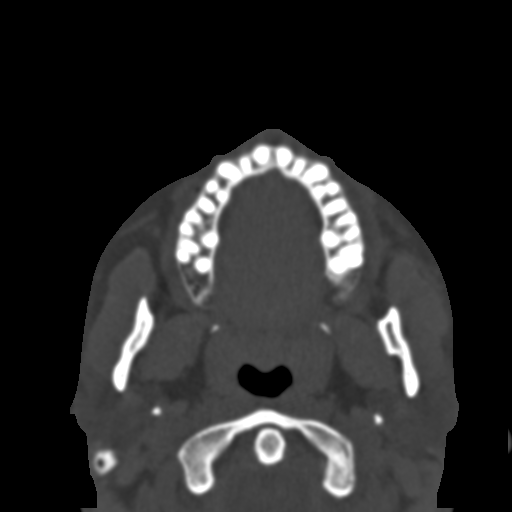
[im 25/113  bone]
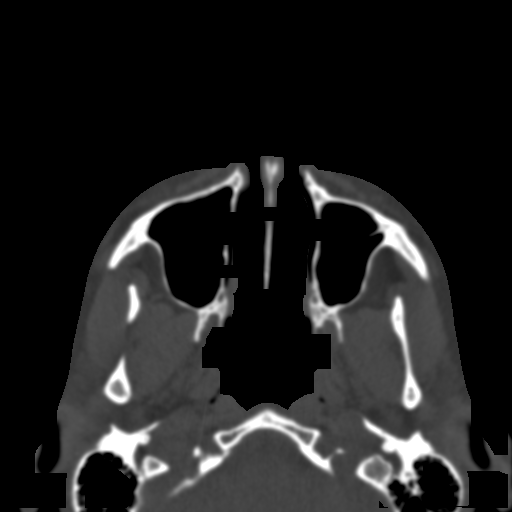
[im 41/113  bone]
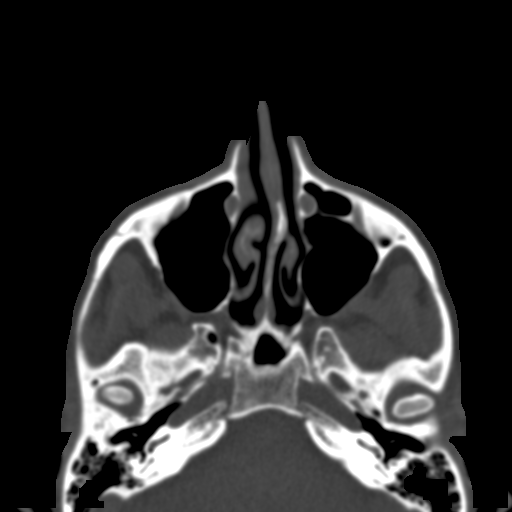
[im 49/113  bone]
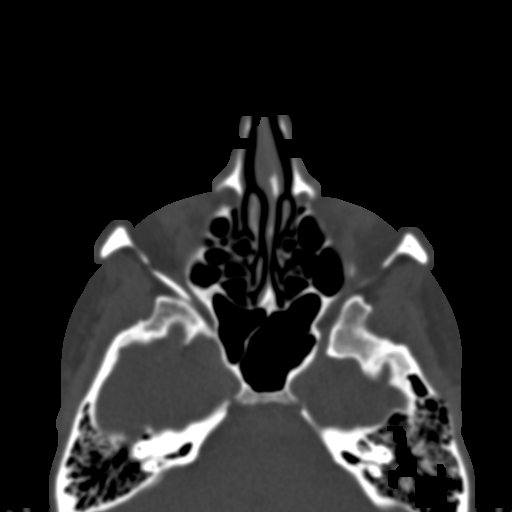
[im 65/113  brain]
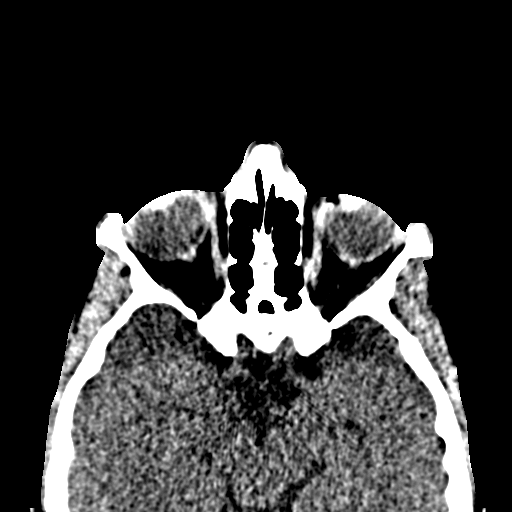
[im 65/113  bone]
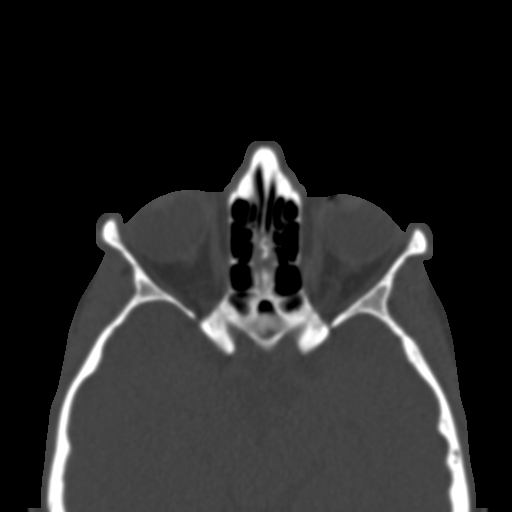
[im 73/113  bone]
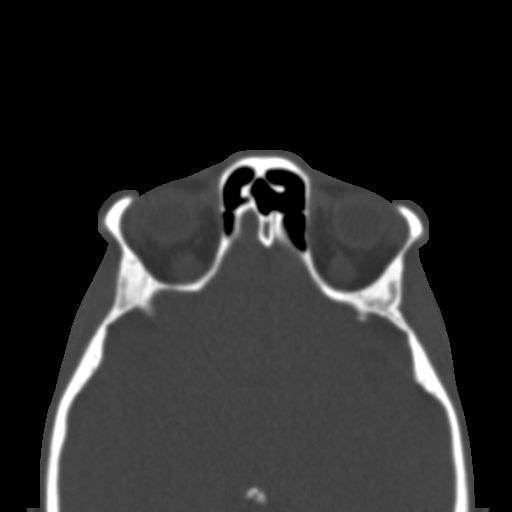
[im 89/113  bone]
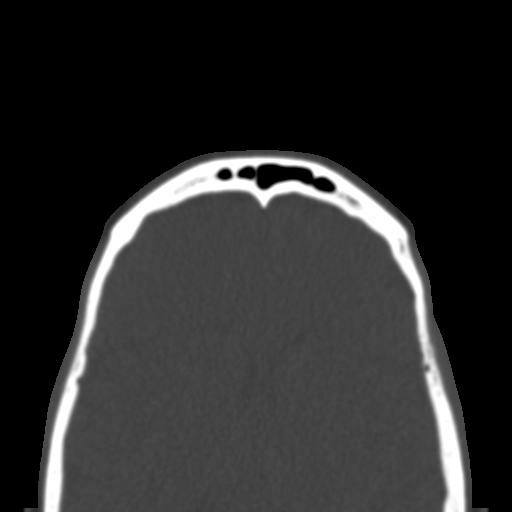
[im 105/113  bone]
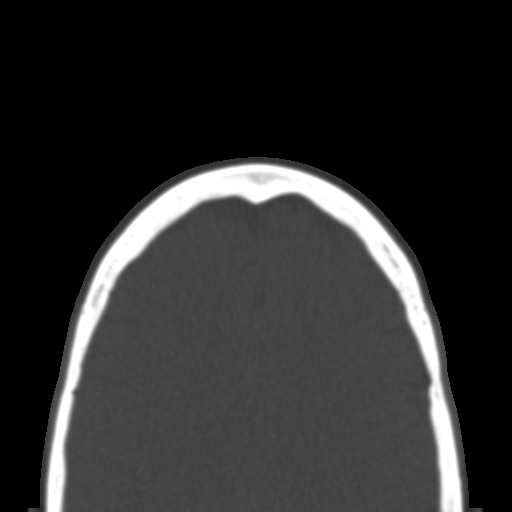

[Series 4: stealth sinus coro 2.0 · coronal · 0.21mm/px · 3 of 83 slices shown]
[im 28/83  bone]
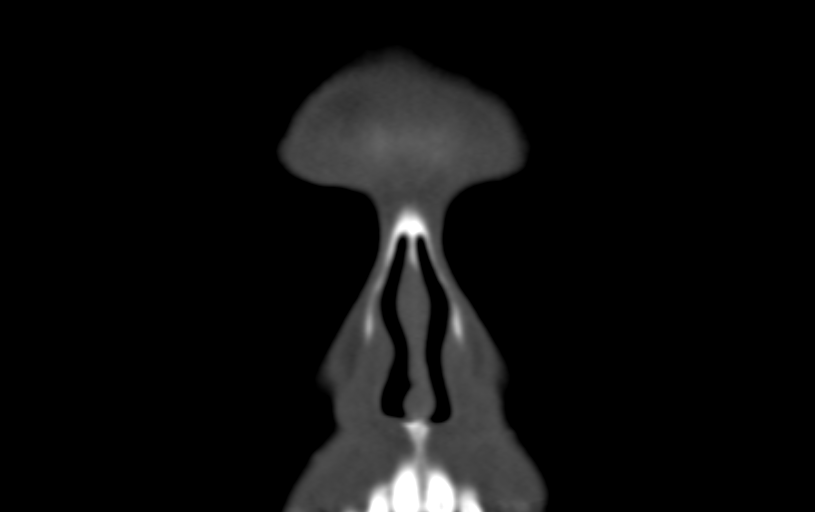
[im 37/83  bone]
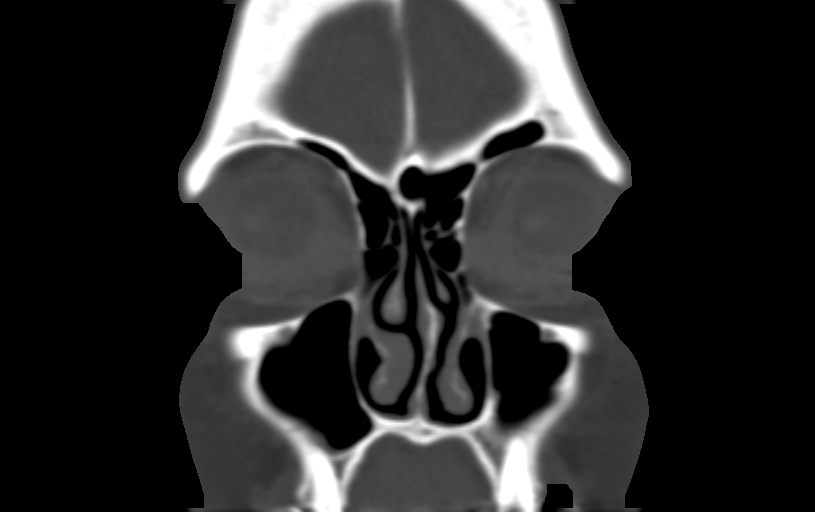
[im 46/83  bone]
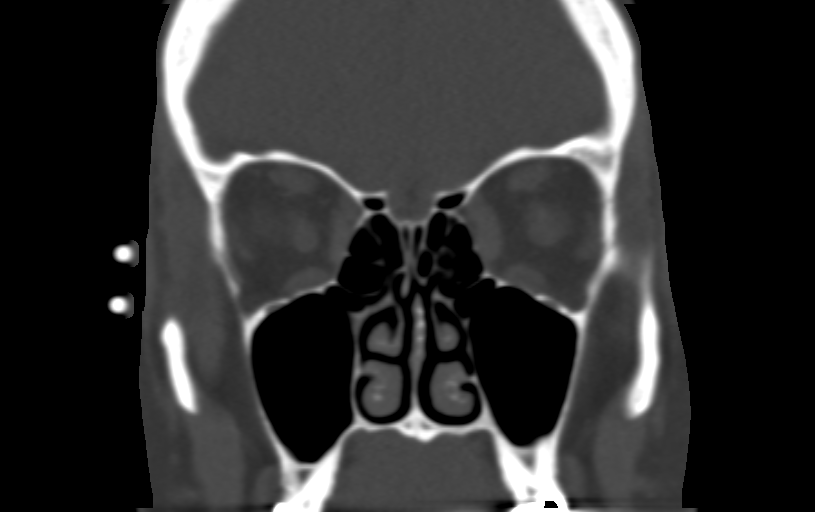

[Series 5: stealth sinus sag 2.0 · sagittal · 0.22mm/px · 3 of 86 slices shown]
[im 29/86  bone]
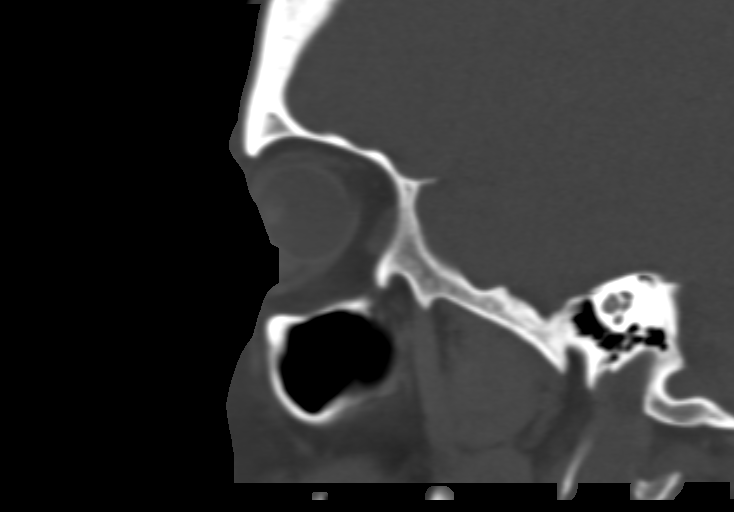
[im 43/86  bone]
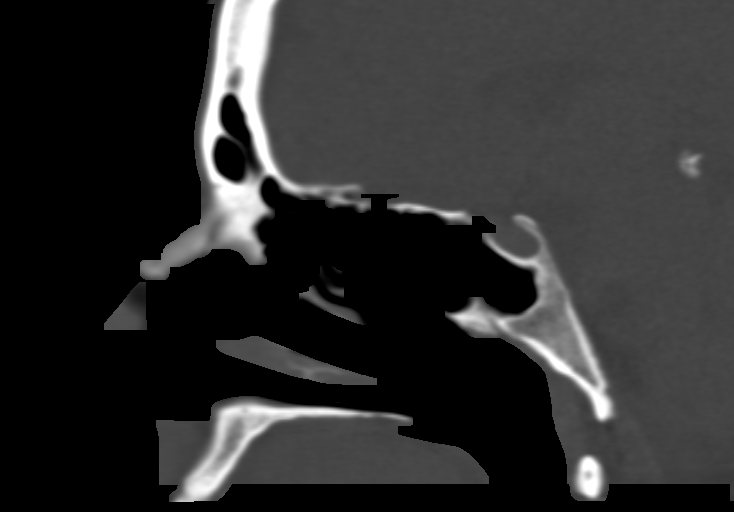
[im 57/86  bone]
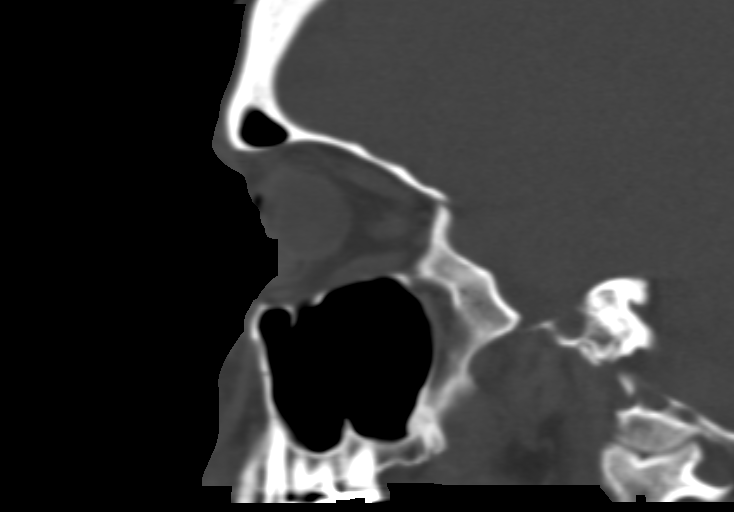

[14 of 47 positions shown; findings below may reference images not displayed]

FINDINGS: Mild mucosal thickening is noted along the floor of the maxillary
sinus bilaterally. A polyp or mucous retention cyst is evident along
the superior medial aspect of the left maxillary sinus. A prominent
Haller cell obstructs the left ostiomeatal complex. There is
moderate narrowing of the right ostiomeatal complex. Ethmoid air
cells are clear. The frontal sinuses and sphenoid sinuses are clear.

Limited imaging of the brain is unremarkable. The globes and orbits
are intact. Leftward nasal septal spurring extends 5 mm in the
midline and contacts the left middle turbinate.
IMPRESSION: 1. Prominent left-sided Haller cell obstructs the left ostiomeatal
complex.
2. Moderate narrowing of the right ostiomeatal complex.
3. Mild mucosal thickening along the floor of the maxillary sinuses
bilaterally.
4. 6 mm polyp or mucous retention cyst along the superior medial
left maxillary sinus. The

## 2016-11-16 ENCOUNTER — Ambulatory Visit: Payer: Commercial Managed Care - HMO | Admitting: Urology

## 2018-03-05 ENCOUNTER — Other Ambulatory Visit (HOSPITAL_COMMUNITY): Payer: Self-pay | Admitting: Family Medicine

## 2018-03-05 DIAGNOSIS — Z1231 Encounter for screening mammogram for malignant neoplasm of breast: Secondary | ICD-10-CM

## 2018-03-10 ENCOUNTER — Ambulatory Visit (HOSPITAL_COMMUNITY)
Admission: RE | Admit: 2018-03-10 | Discharge: 2018-03-10 | Disposition: A | Discharge status: Home / Self Care | Payer: 59 | Source: Ambulatory Visit | Attending: Family Medicine | Admitting: Family Medicine

## 2018-03-10 ENCOUNTER — Encounter (HOSPITAL_COMMUNITY): Payer: Self-pay

## 2018-03-10 DIAGNOSIS — Z1231 Encounter for screening mammogram for malignant neoplasm of breast: Secondary | ICD-10-CM | POA: Insufficient documentation

## 2019-04-23 ENCOUNTER — Other Ambulatory Visit (HOSPITAL_COMMUNITY): Payer: Self-pay | Admitting: Family Medicine

## 2019-04-23 DIAGNOSIS — Z1231 Encounter for screening mammogram for malignant neoplasm of breast: Secondary | ICD-10-CM

## 2020-07-06 ENCOUNTER — Other Ambulatory Visit: Payer: Self-pay

## 2020-07-06 DIAGNOSIS — Z20822 Contact with and (suspected) exposure to covid-19: Secondary | ICD-10-CM

## 2020-07-07 LAB — SARS-COV-2, NAA 2 DAY TAT

## 2020-07-07 LAB — NOVEL CORONAVIRUS, NAA: SARS-CoV-2, NAA: NOT DETECTED

## 2020-07-11 ENCOUNTER — Other Ambulatory Visit: Payer: Self-pay

## 2020-07-11 ENCOUNTER — Other Ambulatory Visit: Payer: 59

## 2020-07-11 DIAGNOSIS — Z20822 Contact with and (suspected) exposure to covid-19: Secondary | ICD-10-CM

## 2020-07-12 LAB — NOVEL CORONAVIRUS, NAA: SARS-CoV-2, NAA: NOT DETECTED

## 2020-07-12 LAB — SARS-COV-2, NAA 2 DAY TAT

## 2020-07-12 LAB — SPECIMEN STATUS REPORT

## 2020-09-13 ENCOUNTER — Other Ambulatory Visit: Payer: Self-pay

## 2020-09-13 ENCOUNTER — Other Ambulatory Visit: Payer: 59

## 2020-09-13 DIAGNOSIS — Z20822 Contact with and (suspected) exposure to covid-19: Secondary | ICD-10-CM

## 2020-09-15 LAB — NOVEL CORONAVIRUS, NAA: SARS-CoV-2, NAA: NOT DETECTED

## 2020-09-15 LAB — SARS-COV-2, NAA 2 DAY TAT

## 2020-11-25 ENCOUNTER — Encounter (HOSPITAL_COMMUNITY): Payer: Self-pay

## 2023-06-26 ENCOUNTER — Other Ambulatory Visit (HOSPITAL_COMMUNITY): Payer: Self-pay | Admitting: Family Medicine

## 2023-06-26 DIAGNOSIS — Z1231 Encounter for screening mammogram for malignant neoplasm of breast: Secondary | ICD-10-CM

## 2023-07-04 ENCOUNTER — Ambulatory Visit (HOSPITAL_COMMUNITY)
Admission: RE | Admit: 2023-07-04 | Discharge: 2023-07-04 | Disposition: A | Discharge status: Home / Self Care | Payer: 59 | Source: Ambulatory Visit | Attending: Family Medicine | Admitting: Family Medicine

## 2023-07-04 ENCOUNTER — Encounter (HOSPITAL_COMMUNITY): Payer: Self-pay

## 2023-07-04 DIAGNOSIS — Z1231 Encounter for screening mammogram for malignant neoplasm of breast: Secondary | ICD-10-CM | POA: Diagnosis present

## 2023-09-24 ENCOUNTER — Other Ambulatory Visit: Payer: Self-pay

## 2023-09-24 ENCOUNTER — Encounter (HOSPITAL_COMMUNITY): Payer: Self-pay

## 2023-09-24 ENCOUNTER — Emergency Department (HOSPITAL_COMMUNITY): Payer: Self-pay

## 2023-09-24 ENCOUNTER — Emergency Department (HOSPITAL_COMMUNITY)
Admission: EM | Admit: 2023-09-24 | Discharge: 2023-09-24 | Disposition: A | Discharge status: Home / Self Care | Payer: Self-pay

## 2023-09-24 DIAGNOSIS — M79604 Pain in right leg: Secondary | ICD-10-CM | POA: Insufficient documentation

## 2023-09-24 NOTE — ED Triage Notes (Signed)
Patient stated "I believe I have a blood clot in my leg, the knot has gone down some" on right lower leg shin area. Stated it started 2 hours ago, began to itch leg and noticed the knot. 1/10 pain, describes a sore.

## 2023-09-24 NOTE — Discharge Instructions (Signed)
 It was a pleasure taking care of you today.  You were evaluated in the ER for pain and bruising to your right lower leg.  Your results showed no DVT (blood clot).  The bruising is right over the course of the vein.  He may have had a small area of vein rupture which can cause severe symptoms.  He can take over-the-counter Tylenol or ibuprofen for any discomfort.  It is important to follow-up closely with your primary care doctor and/or any specialist as discussed.  Come back to the ER if you have any new or worsening symptoms.

## 2023-09-24 NOTE — ED Provider Notes (Signed)
 [place] EMERGENCY DEPARTMENT AT [facility] Provider Note   CSN: [id number] Arrival date & time: 09/24/23  1520     History  Chief Complaint  Patient presents with   DVT    Right leg.    Erin Luna is a 57 y.o. female.  HPI     Home Medications Prior to Admission medications   Medication Sig Start Date End Date Taking? Authorizing Provider  buPROPion (WELLBUTRIN SR) 150 MG 12 hr tablet Take 150 mg by mouth daily.    [provider]  DULoxetine (CYMBALTA) 60 MG capsule Take 60 mg by mouth daily.    [provider]  polyethylene glycol-electrolytes (NULYTELY/GOLYTELY) 420 G solution Take 4,000 mLs by mouth once. 06/14/15   Ezzard Sonny RAMAN, PA-C  traZODone (DESYREL) 50 MG tablet Take 50 mg by mouth at bedtime.    [provider]      Allergies    Penicillins    Review of Systems   Review of Systems  Physical Exam Updated Vital Signs BP 135/87   Pulse 95   Temp 98.6 F (37 C) (Oral)   Resp 17   Ht 5' 7.5 (1.715 m)   Wt 80.7 kg   LMP 02/24/2018   SpO2 100%   BMI 27.47 kg/m  Physical Exam Vitals and nursing note reviewed.  Constitutional:      General: She is not in acute distress.    Appearance: She is well-developed.  HENT:     Head: Normocephalic and atraumatic.  Eyes:     Conjunctiva/sclera: Conjunctivae normal.  Cardiovascular:     Rate and Rhythm: Normal rate and regular rhythm.     Heart sounds: No murmur heard. Pulmonary:     Effort: Pulmonary effort is normal. No respiratory distress.     Breath sounds: Normal breath sounds.  Musculoskeletal:        General: No swelling.     Cervical back: Neck supple.     Right lower leg: No edema.     Left lower leg: No edema.     Comments: Small area of ecchymosis right anterior lower leg along course of vein  Skin:    General: Skin is warm and dry.     Capillary Refill: Capillary refill takes less than 2 seconds.  Neurological:     General: No focal deficit  present.     Mental Status: She is alert and oriented to person, place, and time.  Psychiatric:        Mood and Affect: Mood normal.     ED Results / Procedures / Treatments   Labs (all labs ordered are listed, but only abnormal results are displayed) Labs Reviewed - No data to display  EKG None  Radiology US  Venous Img Lower Right (DVT Study) Result Date: 09/24/2023 CLINICAL DATA:  Right lower extremity pain.  Evaluate for DVT. EXAM: RIGHT LOWER EXTREMITY VENOUS DOPPLER ULTRASOUND TECHNIQUE: Gray-scale sonography with graded compression, as well as color Doppler and duplex ultrasound were performed to evaluate the lower extremity deep venous systems from the level of the common femoral vein and including the common femoral, femoral, profunda femoral, popliteal and calf veins including the posterior tibial, peroneal and gastrocnemius veins when visible. The superficial great saphenous vein was also interrogated. Spectral Doppler was utilized to evaluate flow at rest and with distal augmentation maneuvers in the common femoral, femoral and popliteal veins. COMPARISON:  None Available. FINDINGS: Contralateral Common Femoral Vein: Respiratory phasicity is normal and symmetric  with the symptomatic side. No evidence of thrombus. Normal compressibility. Common Femoral Vein: No evidence of thrombus. Normal compressibility, respiratory phasicity and response to augmentation. Saphenofemoral Junction: No evidence of thrombus. Normal compressibility and flow on color Doppler imaging. Profunda Femoral Vein: No evidence of thrombus. Normal compressibility and flow on color Doppler imaging. Femoral Vein: No evidence of thrombus. Normal compressibility, respiratory phasicity and response to augmentation. Popliteal Vein: No evidence of thrombus. Normal compressibility, respiratory phasicity and response to augmentation. Calf Veins: No evidence of thrombus. Normal compressibility and flow on color Doppler imaging.  Superficial Great Saphenous Vein: No evidence of thrombus. Normal compressibility. Other Findings:  None. IMPRESSION: No evidence of DVT within the right lower extremity. Electronically Signed   By: Norleen Roulette M.D.   On: 09/24/2023 16:51    Procedures Procedures    Medications Ordered in ED Medications - No data to display  ED Course/ Medical Decision Making/ A&P                                 Medical Decision Making Differential diagnosis includes but limited to contusion, sprain, DVT, phlebitis, other  ED course: Patient had pain with a small bruise and a bump on the right anterior lower leg today, states the swelling has gone down but she was worried it could be a blood clot as it is along the course of pain.  No history of VTE, no history of cancer or recent trauma, she is not any hormonal medications.  Ultrasound ordered shows no DVT.  I discussed with the patient that this may have been extreme pain, we discussed conservative care, follow-up and return precautions were given.           Final Clinical Impression(s) / ED Diagnoses Final diagnoses:  Pain of right lower extremity    Rx / DC Orders ED Discharge Orders     None         Suellen Sherran DELENA DEVONNA 09/24/23 1743    Ula Prentice SAUNDERS, MD 09/24/23 2317

## 2024-09-10 ENCOUNTER — Other Ambulatory Visit (HOSPITAL_COMMUNITY): Payer: Self-pay | Admitting: Family Medicine

## 2024-09-10 DIAGNOSIS — Z1231 Encounter for screening mammogram for malignant neoplasm of breast: Secondary | ICD-10-CM

## 2024-09-15 ENCOUNTER — Encounter (INDEPENDENT_AMBULATORY_CARE_PROVIDER_SITE_OTHER): Payer: Self-pay | Admitting: *Deleted

## 2024-09-30 ENCOUNTER — Ambulatory Visit (HOSPITAL_COMMUNITY)

## 2024-10-07 ENCOUNTER — Ambulatory Visit (HOSPITAL_COMMUNITY)
Admission: RE | Admit: 2024-10-07 | Discharge: 2024-10-07 | Disposition: A | Discharge status: Home / Self Care | Source: Ambulatory Visit | Attending: Family Medicine | Admitting: Family Medicine

## 2024-10-07 ENCOUNTER — Encounter (HOSPITAL_COMMUNITY): Payer: Self-pay

## 2024-10-07 DIAGNOSIS — Z1231 Encounter for screening mammogram for malignant neoplasm of breast: Secondary | ICD-10-CM | POA: Diagnosis present

## 2024-11-20 ENCOUNTER — Encounter: Admitting: Obstetrics and Gynecology
